# Patient Record
Sex: Female | Born: 2000
Health system: Southern US, Community
[De-identification: ages and names within clinical notes are randomized; demographics above are authoritative.]

## PROBLEM LIST (undated history)

## (undated) DIAGNOSIS — F329 Major depressive disorder, single episode, unspecified: Secondary | ICD-10-CM

## (undated) DIAGNOSIS — K5900 Constipation, unspecified: Secondary | ICD-10-CM

## (undated) DIAGNOSIS — F419 Anxiety disorder, unspecified: Secondary | ICD-10-CM

## (undated) DIAGNOSIS — F32A Depression, unspecified: Secondary | ICD-10-CM

## (undated) DIAGNOSIS — T7840XA Allergy, unspecified, initial encounter: Secondary | ICD-10-CM

## (undated) HISTORY — DX: Allergy, unspecified, initial encounter: T78.40XA

## (undated) HISTORY — DX: Anxiety disorder, unspecified: F41.9

## (undated) HISTORY — DX: Constipation, unspecified: K59.00

---
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
## 2018-01-23 NOTE — Addendum Note (Signed)
Addended by: Oneida AlarPETERSON, Tami Barren on: 01/23/2018 12:34 PM   Modules accepted: Orders

## 2018-01-23 NOTE — Telephone Encounter (Addendum)
Incoming call from [place], PharmD at [facility] [address]. She states patient is presenting to pharmacy requesting to fill two written prescriptions dated 01/19/2017 and 02/16/2017. She states the date is crossed out and cannot fill these due to this, is calling to request these prescriptions be sent electronically. RN notes a month ago patient had similar situation at [facility] [place], provider had given OK to fill. RN will clarify and then notify [facility] pharmacy, call back number [phone].   Pharmacy in [place] is currently closed. Will call back to clarify later.

## 2018-01-23 NOTE — Telephone Encounter (Addendum)
Phone call to [facility] [place], spoke to pharmacist Clydie BraunKaren. She states that patient had been dispensed Vyvanse 30mg  #30 tabs for prescription dated 01/19/17 on 12/26/17, will be due for refill on 01/25/18.   Patient is requesting a refill of the following medications: Requested Prescriptions   Pending Prescriptions Disp Refills  . lisdexamfetamine (VYVANSE) 30 MG capsule 30 capsule 0    Sig: Take 1 capsule (30 mg total) by mouth daily.    Date of patient request: 01/23/18 Last office visit: 11/19/2017 Date of last refill: 12/26/2016 Last refill amount: #30 Follow up time period per chart: per 11/19/17 OV note: "Attention deficit disorder (ADD) without hyperactivity - gave paper rxs for vyvanse 30mg  qd to be filled on 2/2, 3/2, 3/30. Will defer all future refills"  Provider, please prescribe if agreeable.

## 2018-01-24 NOTE — Telephone Encounter (Signed)
I attempted both of the contact phone numbers listed and get recordings to please try my call again later.   No voicemail to leave a message.   So unable to follow up on the Vyvanse refill request that Lauren Pruitt is inquiring about.

## 2018-01-24 NOTE — Telephone Encounter (Signed)
Please check which[place]pharmacy the [place] wants [place]

## 2018-01-24 NOTE — Telephone Encounter (Signed)
I need to know what she got filled and when in [place] and then if appropriate I will fill.  She can only get a month at a time of this medication though.

## 2018-01-29 ENCOUNTER — Encounter: Payer: Self-pay | Admitting: Physician Assistant

## 2018-01-29 ENCOUNTER — Other Ambulatory Visit: Payer: Self-pay

## 2018-01-29 ENCOUNTER — Ambulatory Visit: Payer: 59 | Admitting: Physician Assistant

## 2018-01-29 VITALS — BP 104/68 | HR 104 | Temp 98.6°F | Resp 18 | Ht 64.03 in | Wt 122.0 lb

## 2018-01-29 DIAGNOSIS — F5105 Insomnia due to other mental disorder: Secondary | ICD-10-CM | POA: Diagnosis not present

## 2018-01-29 DIAGNOSIS — F39 Unspecified mood [affective] disorder: Secondary | ICD-10-CM

## 2018-01-29 DIAGNOSIS — Z3041 Encounter for surveillance of contraceptive pills: Secondary | ICD-10-CM | POA: Diagnosis not present

## 2018-01-29 DIAGNOSIS — F411 Generalized anxiety disorder: Secondary | ICD-10-CM | POA: Diagnosis not present

## 2018-01-29 DIAGNOSIS — F988 Other specified behavioral and emotional disorders with onset usually occurring in childhood and adolescence: Secondary | ICD-10-CM | POA: Diagnosis not present

## 2018-01-29 DIAGNOSIS — R300 Dysuria: Secondary | ICD-10-CM

## 2018-01-29 DIAGNOSIS — F99 Mental disorder, not otherwise specified: Secondary | ICD-10-CM

## 2018-01-29 DIAGNOSIS — K5909 Other constipation: Secondary | ICD-10-CM

## 2018-01-29 DIAGNOSIS — N898 Other specified noninflammatory disorders of vagina: Secondary | ICD-10-CM | POA: Diagnosis not present

## 2018-01-29 MED ORDER — LISDEXAMFETAMINE DIMESYLATE 30 MG PO CAPS: 30.0000 mg | ORAL_CAPSULE | Freq: Every day | ORAL | 0 refills | Status: DC

## 2018-01-29 MED ORDER — NORGESTIMATE-ETH ESTRADIOL 0.25-35 MG-MCG PO TABS: 1.0000 | ORAL_TABLET | Freq: Every day | ORAL | 4 refills | Status: AC

## 2018-01-29 MED ORDER — GABAPENTIN 100 MG PO CAPS: ORAL_CAPSULE | ORAL | 1 refills | Status: DC

## 2018-01-29 MED ORDER — SERTRALINE HCL 100 MG PO TABS: 150.0000 mg | ORAL_TABLET | Freq: Every day | ORAL | 1 refills | Status: DC

## 2018-01-29 NOTE — Progress Notes (Signed)
Lauren Pruitt  MRN: [id number] DOB: Apr 26, 2001  PCP: Morrell RiddleWeber, Sarah L, PA-C  Chief Complaint  Patient presents with  . Medication Refill    medication check     Subjective:  Pt presents to clinic for re-evaluation of ADD and other chronic conditions. Patient has been without Vyvanse for three days as there was confusion [facility] with [place]. Patient states that mood has not been great due to the fact that they haven't moved to [place] as planned and are now living with another family, which is stressful. Patient and family are moving to their own family home on Friday.  Patient is still having pain with urination, and pain with sexual intercourse. She does not have a change in the frequency or urgency of urination. Patient states that she has some vaginal discharge, but that it does not have a distinct color or odor. She has not had a change in sexual partners.  She does note that her symptoms only occur after sex.  They used to use lubricant but has stopped due to low money - she states that her symptoms did start after the lack of lubrication.  History is obtained by patient. Patient is accompanied by mother today.  Review of Systems  Constitutional: Negative.  Negative for activity change, appetite change and unexpected weight change.  HENT: Negative.  Negative for congestion, ear discharge and ear pain.   Respiratory: Negative.  Negative for cough and chest tightness.   Cardiovascular: Negative.  Negative for chest pain and palpitations.  Gastrointestinal: Positive for constipation. Negative for diarrhea, nausea and vomiting.  Genitourinary: Positive for dyspareunia, dysuria, urgency and vaginal discharge. Negative for difficulty urinating, enuresis and frequency.  Neurological: Negative.  Negative for dizziness, light-headedness and headaches.  Psychiatric/Behavioral: Positive for decreased concentration (bad since she has run  out of medications). Negative for dysphoric mood (controlled). The patient is not nervous/anxious (stable).     Patient Active Problem List   Diagnosis Date Noted  . Chronic constipation 11/21/2017  . ADD (attention deficit disorder) 01/06/2016  . Mood disorder (HCC) 07/29/2015  . MDD (major depressive disorder), recurrent severe, without psychosis (HCC)   . GAD (generalized anxiety disorder) 01/17/2015  . Insomnia 01/17/2015  . Exercise-induced asthma 07/10/2011  . Acne comedone 07/10/2011    Current Outpatient Medications on File Prior to Visit  Medication Sig Dispense Refill  . gabapentin (NEURONTIN) 100 MG capsule take 1 capsule by mouth every morning then 3 capsules by mouth every evening 360 capsule 1  . lisdexamfetamine (VYVANSE) 30 MG capsule Take 1 capsule (30 mg total) by mouth daily. 30 capsule 0  . lisdexamfetamine (VYVANSE) 30 MG capsule Take 1 capsule (30 mg total) by mouth daily. 30 capsule 0  . lisdexamfetamine (VYVANSE) 30 MG capsule Take 1 capsule (30 mg total) by mouth daily. 30 capsule 0  . LORazepam (ATIVAN) 0.5 MG tablet Take 0.5-1 tablets (0.25-0.5 mg total) by mouth every 8 (eight) hours. 10 tablet 0  . norgestimate-ethinyl estradiol (ORTHO-CYCLEN, 28,) 0.25-35 MG-MCG tablet Take 1 tablet by mouth daily. 3 Package 4  . sertraline (ZOLOFT) 100 MG tablet Take 1.5 tablets (150 mg total) by mouth daily. 135 tablet 1  . traZODone (DESYREL) 100 MG tablet Take 2 tablets (200 mg total) by mouth at bedtime. 180 tablet 1   No current facility-administered medications on file prior to visit.     No Known Allergies  Past Medical History:  Diagnosis Date  .  Allergy    RHINITIS  . Anxiety   . Constipation   . Depression    Social History   Social History Narrative   Lives with mother and adult brother   Parents divorced   Social History   Tobacco Use  . Smoking status: Never Smoker  . Smokeless tobacco: Never Used  Substance Use Topics  . Alcohol use: No      Alcohol/week: 0.0 oz  . Drug use: Yes    Types: Marijuana   family history is not on file.     Objective:  BP 104/68   Pulse (!) 104   Temp 98.6 F (37 C) (Oral)   Resp 18   Ht 5' 4.03" (1.626 m)   Wt 122 lb (55.3 kg)   LMP 12/31/2017   SpO2 98%   BMI 20.92 kg/m  Body mass index is 20.92 kg/m.  Physical Exam  Constitutional: She is oriented to person, place, and time and well-developed, well-nourished, and in no distress.  BP 104/68   Pulse (!) 104   Temp 98.6 F (37 C) (Oral)   Resp 18   Ht 5' 4.03" (1.626 m)   Wt 122 lb (55.3 kg)   LMP 12/31/2017   SpO2 98%   BMI 20.92 kg/m   HENT:  Head: Normocephalic and atraumatic.  Right Ear: External ear normal.  Left Ear: External ear normal.  Nose: Nose normal.  Mouth/Throat: Oropharynx is clear and moist.  Patient has geographic tongue.  Eyes: Conjunctivae and EOM are normal. Pupils are equal, round, and reactive to light. Right eye exhibits no discharge. Left eye exhibits no discharge.  Neck: Normal range of motion. Neck supple.  Cardiovascular: Normal rate, regular rhythm, normal heart sounds and intact distal pulses. Exam reveals no gallop and no friction rub.  No murmur heard. Pulmonary/Chest: Effort normal and breath sounds normal.  Abdominal: Soft. Bowel sounds are normal.  Musculoskeletal: Normal range of motion.  Neurological: She is alert and oriented to person, place, and time. She has normal reflexes. Gait normal. GCS score is 15.  Skin: Skin is warm and dry. No rash noted. No erythema. No pallor.  Psychiatric: Mood, memory, affect and judgment normal.   Wt Readings from Last 3 Encounters:  01/29/18 122 lb (55.3 kg) (42 %, Z= -0.20)*  11/19/17 118 lb (53.5 kg) (35 %, Z= -0.40)*  09/25/17 116 lb 3.2 oz (52.7 kg) (31 %, Z= -0.49)*   * Growth percentiles are based on CDC (Girls, 2-20 Years) data.    Assessment and Plan :  Attention deficit disorder (ADD) without hyperactivity  Dysuria  Insomnia  due to other mental disorder  GAD (generalized anxiety disorder)  Chronic constipation  Mood disorder (HCC)   Patient's mood are currently well-managed on her current medication regime, despite stressful life transitions. Recommended Miralax for chronic constipation. Patient needs medications refilled. A wet prep was done at this visit. Patient will be updated with results of wet prep. Suspect that her vaginal discomfort is due to friction - she will increase lubrication during sex and let me know.  Follow up in 6 months for re-evaluation of ADD.  Benny Lennert PA-C  Primary Care at [facility] 01/29/2018 5:23 PM

## 2018-01-29 NOTE — Patient Instructions (Signed)
     IF you received an x-ray today, you will receive an invoice from [facility]. Please contact [facility] at [phone] with questions or concerns regarding your invoice.   IF you received labwork today, you will receive an invoice from [facility]. Please contact [facility] at [phone] with questions or concerns regarding your invoice.   Our billing staff will not be able to assist you with questions regarding bills from these companies.  You will be contacted with the lab results as soon as they are available. The fastest way to get your results is to activate your My Chart account. Instructions are located on the last page of this paperwork. If you have not heard from us regarding the results in 2 weeks, please contact this office.     

## 2018-01-30 LAB — WET PREP FOR TRICH, YEAST, CLUE
Clue Cell Exam: NEGATIVE
TRICHOMONAS EXAM: NEGATIVE
Yeast Exam: NEGATIVE

## 2018-02-04 ENCOUNTER — Encounter: Payer: Self-pay | Admitting: Physician Assistant

## 2018-04-24 ENCOUNTER — Telehealth: Payer: Self-pay | Admitting: Physician Assistant

## 2018-04-24 NOTE — Telephone Encounter (Signed)
Incoming [facility] [address] [facility] regarding Vyvanse 30mg  capsule- plan does not cover medication. Call plan at [phone], ID is [id number]. Unable to process through CoverMyMeds - needs subscriber address.   Phone call to patient, spoke with mother, [place]. Address provided - [address], [place], [place] [address]. CoverMyMeds unable to process Prior auth, will have to call. [place]ia verbalizes understanding.

## 2018-04-29 NOTE — Telephone Encounter (Signed)
vyvanse is approved 04/23/2018 thru 04/24/2019

## 2018-05-22 ENCOUNTER — Other Ambulatory Visit: Payer: Self-pay | Admitting: Physician Assistant

## 2018-05-22 DIAGNOSIS — F988 Other specified behavioral and emotional disorders with onset usually occurring in childhood and adolescence: Secondary | ICD-10-CM

## 2018-05-22 NOTE — Telephone Encounter (Signed)
Patient is requesting a refill of the following medications: Requested Prescriptions   Pending Prescriptions Disp Refills  . lisdexamfetamine (VYVANSE) 30 MG capsule 30 capsule 0    Sig: Take 1 capsule (30 mg total) by mouth daily.    Date of patient request: 05/22/2018 Last office visit: 01/29/2018 Date of last refill: 03/31/2018 Last refill amount: 30 Follow up time period per chart:

## 2018-05-22 NOTE — Telephone Encounter (Signed)
Copied from CRM [phone]. Topic: Quick Communication - Rx Refill/Question >> May 22, 2018 10:24 AM Cipriano BunkerLambe, Annette S wrote: Medication:  lisdexamfetamine (VYVANSE) 30 MG capsule  Has the patient contacted their pharmacy? Yes.   (Agent: If no, request that the patient contact the pharmacy for the refill.) (Agent: If yes, when and what did the pharmacy advise?)  Preferred Pharmacy (with phone number or street name):   [id number] [place], [place] -- [id number] [facility] [address] [place] [address] Phone: [phone] Fax: [phone]    Agent: Please be advised that RX refills may take up to 3 business days. We ask that you follow-up with your pharmacy.

## 2018-05-22 NOTE — Telephone Encounter (Signed)
Vyvanse refill Last Refill:03/31/18 #30 Last OV: 01/29/18 PCP: Lilia ArgueSarah Weber,PA [facility] [address]

## 2018-05-23 MED ORDER — LISDEXAMFETAMINE DIMESYLATE 30 MG PO CAPS: 30.0000 mg | ORAL_CAPSULE | Freq: Every day | ORAL | 0 refills | Status: DC

## 2018-05-23 NOTE — Telephone Encounter (Signed)
Done

## 2018-07-18 ENCOUNTER — Other Ambulatory Visit: Payer: Self-pay | Admitting: Family Medicine

## 2018-07-18 DIAGNOSIS — F99 Mental disorder, not otherwise specified: Principal | ICD-10-CM

## 2018-07-18 DIAGNOSIS — F5105 Insomnia due to other mental disorder: Secondary | ICD-10-CM

## 2018-07-18 NOTE — Telephone Encounter (Signed)
Trazodone 100 mg refill Last Refill:11/19/17 # 180 Last OV: 01/29/18 PCP: Valarie ConesWeber [id number]  Sleeping pills not covered under protocol.

## 2018-07-23 ENCOUNTER — Other Ambulatory Visit: Payer: Self-pay | Admitting: Physician Assistant

## 2018-07-23 DIAGNOSIS — F39 Unspecified mood [affective] disorder: Secondary | ICD-10-CM

## 2018-07-31 ENCOUNTER — Other Ambulatory Visit: Payer: Self-pay

## 2018-07-31 DIAGNOSIS — F5105 Insomnia due to other mental disorder: Secondary | ICD-10-CM

## 2018-07-31 DIAGNOSIS — F99 Mental disorder, not otherwise specified: Principal | ICD-10-CM

## 2018-07-31 MED ORDER — TRAZODONE HCL 100 MG PO TABS: 200.0000 mg | ORAL_TABLET | Freq: Every day | ORAL | 0 refills | Status: DC

## 2018-08-01 ENCOUNTER — Other Ambulatory Visit: Payer: Self-pay

## 2018-08-01 ENCOUNTER — Ambulatory Visit: Payer: 59 | Admitting: Physician Assistant

## 2018-08-01 ENCOUNTER — Encounter: Payer: Self-pay | Admitting: Physician Assistant

## 2018-08-01 VITALS — BP 123/83 | HR 108 | Temp 98.5°F | Resp 18 | Ht 64.05 in | Wt 128.6 lb

## 2018-08-01 DIAGNOSIS — F988 Other specified behavioral and emotional disorders with onset usually occurring in childhood and adolescence: Secondary | ICD-10-CM | POA: Diagnosis not present

## 2018-08-01 DIAGNOSIS — F5105 Insomnia due to other mental disorder: Secondary | ICD-10-CM

## 2018-08-01 DIAGNOSIS — N644 Mastodynia: Secondary | ICD-10-CM | POA: Diagnosis not present

## 2018-08-01 DIAGNOSIS — F99 Mental disorder, not otherwise specified: Secondary | ICD-10-CM

## 2018-08-01 DIAGNOSIS — F39 Unspecified mood [affective] disorder: Secondary | ICD-10-CM | POA: Diagnosis not present

## 2018-08-01 DIAGNOSIS — F411 Generalized anxiety disorder: Secondary | ICD-10-CM

## 2018-08-01 MED ORDER — LISDEXAMFETAMINE DIMESYLATE 30 MG PO CAPS: 30.0000 mg | ORAL_CAPSULE | Freq: Every day | ORAL | 0 refills | Status: DC

## 2018-08-01 MED ORDER — SERTRALINE HCL 100 MG PO TABS: 150.0000 mg | ORAL_TABLET | Freq: Every day | ORAL | 1 refills | Status: AC

## 2018-08-01 MED ORDER — TRAZODONE HCL 100 MG PO TABS: 200.0000 mg | ORAL_TABLET | Freq: Every day | ORAL | 0 refills | Status: DC

## 2018-08-01 MED ORDER — GABAPENTIN 100 MG PO CAPS: ORAL_CAPSULE | ORAL | 1 refills | Status: AC

## 2018-08-01 MED ORDER — BUSPIRONE HCL 10 MG PO TABS: 10.0000 mg | ORAL_TABLET | Freq: Three times a day (TID) | ORAL | 1 refills | Status: AC

## 2018-08-01 MED ORDER — CEPHALEXIN 500 MG PO CAPS: 500.0000 mg | ORAL_CAPSULE | Freq: Three times a day (TID) | ORAL | 0 refills | Status: AC

## 2018-08-01 MED ORDER — LISDEXAMFETAMINE DIMESYLATE 30 MG PO CAPS: 30.0000 mg | ORAL_CAPSULE | Freq: Every day | ORAL | 0 refills | Status: AC

## 2018-08-01 NOTE — Patient Instructions (Addendum)
[facility] - [address], [place], [place] [address] Phone: [phone]  For buspar - start with 1/2 pill 2x/day and then in a week increase to a full pill 2x/day    If you have lab work done today you will be contacted with your lab results within the next 2 weeks.  If you have not heard from us then please contact us. The fastest way to get your results is to register for My Chart.   IF you received an x-ray today, you will receive an invoice from [facility]. Please contact [facility] at [phone] with questions or concerns regarding your invoice.   IF you received labwork today, you will receive an invoice from [facility]. Please contact [facility] at [phone] with questions or concerns regarding your invoice.   Our billing staff will not be able to assist you with questions regarding bills from these companies.  You will be contacted with the lab results as soon as they are available. The fastest way to get your results is to activate your My Chart account. Instructions are located on the last page of this paperwork. If you have not heard from us regarding the results in 2 weeks, please contact this office.

## 2018-08-01 NOTE — Progress Notes (Signed)
Lauren Pruitt  MRN: [id number] DOB: 08-13-2001  PCP: Morrell Riddle, PA-C  Chief Complaint  Patient presents with  . Medication Refill    discuss meds and look at nipple rings     Subjective:  Pt presents to clinic for medication refill.  She wants to find a job but she is having problems with anxiety. Plans to go to [facility] in the spring.  She still has anxiety is social situation and when she has to be around people.  She has ativan which she rarely uses.  Her vyvanse is doing good for her ADD control.  Her mood is stable.  Bilateral breast piercing several months ago - getting pus - unsure if clear or white and now pain - no redness or swelling - she has had a similar reaction to other piercings in the past but she does not feel like they have lasted as long as these.  She has been cleaning how she was instructed.  She is sleeping until 11am and going to bed about 12-1 - no activity during the day - mainly electronic devices  History is obtained by patient.  Review of Systems  Constitutional: Negative for chills and fever.    Patient Active Problem List   Diagnosis Date Noted  . Chronic constipation 11/21/2017  . ADD (attention deficit disorder) 01/06/2016  . Mood disorder (HCC) 07/29/2015  . MDD (major depressive disorder), recurrent severe, without psychosis (HCC)   . GAD (generalized anxiety disorder) 01/17/2015  . Insomnia 01/17/2015  . Exercise-induced asthma 07/10/2011  . Acne comedone 07/10/2011    Current Outpatient Medications on File Prior to Visit  Medication Sig Dispense Refill  . LORazepam (ATIVAN) 0.5 MG tablet Take 0.5-1 tablets (0.25-0.5 mg total) by mouth every 8 (eight) hours. 10 tablet 0  . norgestimate-ethinyl estradiol (ORTHO-CYCLEN, 28,) 0.25-35 MG-MCG tablet Take 1 tablet by mouth daily. 3 Package 4   No current facility-administered medications on file prior to visit.     No Known Allergies  Past Medical History:  Diagnosis Date  .  Allergy    RHINITIS  . Anxiety   . Constipation   . Depression    Social History   Social History Narrative   Lives with mother and adult brother   Parents divorced   Social History   Tobacco Use  . Smoking status: Never Smoker  . Smokeless tobacco: Never Used  Substance Use Topics  . Alcohol use: No    Alcohol/week: 0.0 standard drinks  . Drug use: Yes    Types: Marijuana   family history is not on file.     Objective:  BP 123/83   Pulse (!) 108   Temp 98.5 F (36.9 C) (Oral)   Resp 18   Ht 5' 4.05" (1.627 m)   Wt 128 lb 9.6 oz (58.3 kg)   LMP 07/09/2018   SpO2 96%   BMI 22.04 kg/m  Body mass index is 22.04 kg/m.  Wt Readings from Last 3 Encounters:  08/01/18 128 lb 9.6 oz (58.3 kg) (53 %, Z= 0.07)*  01/29/18 122 lb (55.3 kg) (42 %, Z= -0.20)*  11/19/17 118 lb (53.5 kg) (35 %, Z= -0.40)*   * Growth percentiles are based on CDC (Girls, 2-20 Years) data.    Physical Exam  Constitutional: She is oriented to person, place, and time. She appears well-developed and well-nourished.  HENT:  Head: Normocephalic and atraumatic.  Right Ear: Hearing and external ear normal.  Left Ear: Hearing and  external ear normal.  Eyes: Conjunctivae are normal.  Neck: Normal range of motion.  Cardiovascular: Normal rate, regular rhythm and normal heart sounds.  No murmur heard. Pulmonary/Chest: Effort normal and breath sounds normal. She has no wheezes.  Bilateral nipple piercing with barbells - edges of wound is healed - clear dried discharge on the barbell - no erythema of nipple - but there is some TTP around the piercing  Neurological: She is alert and oriented to person, place, and time.  Skin: Skin is warm and dry.  Psychiatric: Judgment normal.  Vitals reviewed.   Assessment and Plan :  Nipple pain - Plan: cephALEXin (KEFLEX) 500 MG capsule - continue cleaning - start oral abx due to tenderness and do warm compresses - watch and wait but suspect this is part of the  normal healing process of this area  Mood disorder (HCC) - Plan: gabapentin (NEURONTIN) 100 MG capsule, sertraline (ZOLOFT) 100 MG tablet - continue  Attention deficit disorder (ADD) without hyperactivity - Plan: lisdexamfetamine (VYVANSE) 30 MG capsule, lisdexamfetamine (VYVANSE) 30 MG capsule, lisdexamfetamine (VYVANSE) 30 MG capsule - continue current medications  Insomnia due to other mental disorder - Plan: traZODone (DESYREL) 100 MG tablet - sometimes does not work - encouraged patient to have some type of physical activity - suspect pt is over sleeping and therefoe not tired and therefore the medication is not helping that muc  Generalized anxiety disorder - Plan: busPIRone (BUSPAR) 10 MG tablet - anxiety has always been a problem for the patient but while she was in school this was what she knew and now that situation is changing she finds herself not doing things because of her anxiety - we will do a trial of buspar to see if that will help as her mood is currently not the issue - physical activity might also help with this - encouraged return to therapy to help with transition  Patient verbalized to me that they understand the following: diagnosis, what is being done for them, what to expect and what should be done at home.  Their questions have been answered.  See after visit summary for patient specific instructions.  Benny LennertSarah Weber PA-C  Primary Care at [facility] 08/01/2018 9:31 AM  Please note: Portions of this report may have been transcribed using dragon voice recognition software. Every effort was made to ensure accuracy; however, inadvertent computerized transcription errors may be present.

## 2018-08-03 NOTE — Telephone Encounter (Signed)
This is weber's pt and her cma sent in refills on this med already

## 2018-11-01 ENCOUNTER — Telehealth: Payer: Self-pay | Admitting: Family Medicine

## 2018-11-01 DIAGNOSIS — F99 Mental disorder, not otherwise specified: Principal | ICD-10-CM

## 2018-11-01 DIAGNOSIS — F5105 Insomnia due to other mental disorder: Secondary | ICD-10-CM

## 2018-11-01 DIAGNOSIS — F988 Other specified behavioral and emotional disorders with onset usually occurring in childhood and adolescence: Secondary | ICD-10-CM

## 2018-11-01 MED ORDER — LISDEXAMFETAMINE DIMESYLATE 30 MG PO CAPS: 30.0000 mg | ORAL_CAPSULE | Freq: Every day | ORAL | 0 refills | Status: AC

## 2018-11-01 MED ORDER — TRAZODONE HCL 100 MG PO TABS: 200.0000 mg | ORAL_TABLET | Freq: Every day | ORAL | 0 refills | Status: AC

## 2018-11-01 NOTE — Telephone Encounter (Signed)
Pt.'s mother came into office requesting refills on Trazodone, and Vyvanse, for the pt. until visit on 12/19/18

## 2018-11-01 NOTE — Telephone Encounter (Signed)
Previous patient of Benny Lennertsarah weber Last OV sept 2019 Stable on regime pmp reviewed meds refilled

## 2018-12-19 ENCOUNTER — Ambulatory Visit: Payer: 59 | Admitting: Family Medicine

## 2019-12-22 ENCOUNTER — Emergency Department (HOSPITAL_COMMUNITY)
Admission: EM | Admit: 2019-12-22 | Discharge: 2019-12-22 | Disposition: A | Discharge status: Home / Self Care | Payer: Self-pay | Attending: Emergency Medicine | Admitting: Emergency Medicine

## 2019-12-22 ENCOUNTER — Encounter (HOSPITAL_COMMUNITY): Payer: Self-pay | Admitting: Emergency Medicine

## 2019-12-22 ENCOUNTER — Other Ambulatory Visit: Payer: Self-pay

## 2019-12-22 DIAGNOSIS — Z79899 Other long term (current) drug therapy: Secondary | ICD-10-CM | POA: Insufficient documentation

## 2019-12-22 DIAGNOSIS — F419 Anxiety disorder, unspecified: Secondary | ICD-10-CM | POA: Insufficient documentation

## 2019-12-22 NOTE — ED Triage Notes (Signed)
Pt arrived via EMS. Pt has had severe anxiety, and has hx of anxiety. Pt admitted to having "some patron", and has said that she has had no other drugs. [name] pupils are dilated. Pt was given 5 of haldol IM from EMS.

## 2019-12-22 NOTE — ED Provider Notes (Signed)
[facility]-EMERGENCY DEPT Provider Note   CSN: [id number] Arrival date & time: 12/22/19  0211     History Chief Complaint  Patient presents with  . Anxiety  . Alcohol Intoxication    Lauren Pruitt is a 19 y.o. female.  The history is provided by the EMS personnel and medical records.  Anxiety  Alcohol Intoxication    LEVEL V CAVEAT:  INTOXICATION  19 y.o. F with hx of anxiety, depression, presenting to the ED via EMS for reported anxiety.  Reported to them that she has been having worsening anxiety over the past few days.  She was drinking some Pat[place] tonight but denied illicit drug use.  EMS gave Haldol in route, patient sleeping on arrival.  No other details known at this time.  Past Medical History:  Diagnosis Date  . Allergy    RHINITIS  . Anxiety   . Constipation   . Depression     Patient Active Problem List   Diagnosis Date Noted  . Chronic constipation 11/21/2017  . ADD (attention deficit disorder) 01/06/2016  . Mood disorder (HCC) 07/29/2015  . MDD (major depressive disorder), recurrent severe, without psychosis (HCC)   . GAD (generalized anxiety disorder) 01/17/2015  . Insomnia 01/17/2015  . Exercise-induced asthma 07/10/2011  . Acne comedone 07/10/2011    History reviewed. No pertinent surgical history.   OB History   No obstetric history on file.     History reviewed. No pertinent family history.  Social History   Tobacco Use  . Smoking status: Never Smoker  . Smokeless tobacco: Never Used  Substance Use Topics  . Alcohol use: Yes    Alcohol/week: 0.0 standard drinks    Comment: [employer]  . Drug use: Yes    Types: Marijuana    Home Medications Prior to Admission medications   Medication Sig Start Date End Date Taking? Authorizing Provider  busPIRone (BUSPAR) 10 MG tablet Take 1 tablet (10 mg total) by mouth 3 (three) times daily. 08/01/18   Weber, Dema Severin, PA-C  cephALEXin (KEFLEX) 500 MG capsule Take 1  capsule (500 mg total) by mouth 3 (three) times daily. 08/01/18   Valarie Cones, Dema Severin, PA-C  gabapentin (NEURONTIN) 100 MG capsule take 1 capsule by mouth every morning then 3 capsules by mouth every evening 08/01/18   Weber, Dema Severin, PA-C  lisdexamfetamine (VYVANSE) 30 MG capsule Take 1 capsule (30 mg total) by mouth daily. 08/01/18   Weber, Dema Severin, PA-C  lisdexamfetamine (VYVANSE) 30 MG capsule Take 1 capsule (30 mg total) by mouth daily. 12/01/18   Myles Lipps, MD  lisdexamfetamine (VYVANSE) 30 MG capsule Take 1 capsule (30 mg total) by mouth daily. 11/01/18   Myles Lipps, MD  LORazepam (ATIVAN) 0.5 MG tablet Take 0.5-1 tablets (0.25-0.5 mg total) by mouth every 8 (eight) hours. 11/19/17   Sherren Mocha, MD  norgestimate-ethinyl estradiol (ORTHO-CYCLEN, 28,) 0.25-35 MG-MCG tablet Take 1 tablet by mouth daily. 01/29/18   Weber, Dema Severin, PA-C  sertraline (ZOLOFT) 100 MG tablet Take 1.5 tablets (150 mg total) by mouth daily. 08/01/18   Weber, Dema Severin, PA-C  traZODone (DESYREL) 100 MG tablet Take 2 tablets (200 mg total) by mouth at bedtime. 11/01/18   Myles Lipps, MD    Allergies    Patient has no known allergies.  Review of Systems   Review of Systems  Unable to perform ROS: Other    Physical Exam Updated Vital Signs BP 127/74 (BP Location:  Right Arm)   Pulse 82   Temp 97.7 F (36.5 C) (Oral)   Resp 18   Ht 5\' 6"  (1.676 m)   Wt 54.4 kg   SpO2 98%   BMI 19.37 kg/m   Physical Exam Vitals and nursing note reviewed.  Constitutional:      Appearance: She is well-developed.     Comments: Sleeping, arouses to voice but drowsy  HENT:     Head: Normocephalic and atraumatic.  Eyes:     Conjunctiva/sclera: Conjunctivae normal.     Pupils: Pupils are equal, round, and reactive to light.  Cardiovascular:     Rate and Rhythm: Normal rate and regular rhythm.     Heart sounds: Normal heart sounds.  Pulmonary:     Effort: Pulmonary effort is normal.     Breath sounds: Normal breath  sounds.  Abdominal:     General: Bowel sounds are normal.     Palpations: Abdomen is soft.  Musculoskeletal:        General: Normal range of motion.     Cervical back: Normal range of motion.  Skin:    General: Skin is warm and dry.  Neurological:     Mental Status: She is alert and oriented to person, place, and time.     ED Results / Procedures / Treatments   Labs (all labs ordered are listed, but only abnormal results are displayed) Labs Reviewed - No data to display  EKG None  Radiology No results found.  Procedures Procedures (including critical care time)  Medications Ordered in ED Medications - No data to display  ED Course  I have reviewed the triage vital signs and the nursing notes.  Pertinent labs & imaging results that were available during my care of the patient were reviewed by me and considered in my medical decision making (see chart for details).    MDM Rules/Calculators/A&P  19 year old female here with reported anxiety.  Does have EtOH on board and was given Haldol by EMS.  On arrival here she is sleeping.  Vitals are stable.  Will monitor until more awake and alert and can answer further questions.  4:00 AM Patient now awake and alert.  States worsening anxiety over the past few weeks, she is particularly anxious when driving or even being in a car.  States she was in the car tonight with friends after drinking when symptoms got worse.  She denies SI/HI/AVH.  She is on multiple psychiatric medications, follows with psychiatry but has not had an evaluation/follow-up in quite some time.  She denies any physical symptoms or clinical concerns currently.  Do not feel she requires further work-up or ongoing evaluation here in the ED.  She will need to follow-up with her psychiatrist for ongoing management.  Plan discussed with patient and mom via phone, are in agreement and comfortable with care plan.  Will return here for any new/acute changes.  Final  Clinical Impression(s) / ED Diagnoses Final diagnoses:  Anxiety    Rx / DC Orders ED Discharge Orders    None       Larene Pickett, PA-C 12/22/19 0407    Orpah Greek, MD 12/22/19 [phone]

## 2019-12-22 NOTE — Discharge Instructions (Signed)
Please follow-up with your psychiatrist for ongoing management of your anxiety. Return here for any new/acute changes.
